# Patient Record
Sex: Male | Born: 1995 | Race: White | Hispanic: No | Smoking: Never smoker
Health system: Southern US, Community
[De-identification: ages and names within clinical notes are randomized; demographics above are authoritative.]

## PROBLEM LIST (undated history)

## (undated) DIAGNOSIS — I10 Essential (primary) hypertension: Secondary | ICD-10-CM

## (undated) DIAGNOSIS — T7840XA Allergy, unspecified, initial encounter: Secondary | ICD-10-CM

## (undated) DIAGNOSIS — F909 Attention-deficit hyperactivity disorder, unspecified type: Secondary | ICD-10-CM

## (undated) HISTORY — DX: Attention-deficit hyperactivity disorder, unspecified type: F90.9

## (undated) HISTORY — DX: Allergy, unspecified, initial encounter: T78.40XA

---
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
## 2018-04-07 ENCOUNTER — Ambulatory Visit: Payer: Self-pay

## 2018-04-07 ENCOUNTER — Other Ambulatory Visit: Payer: Self-pay | Admitting: Occupational Medicine

## 2018-04-07 DIAGNOSIS — Z0000 Encounter for general adult medical examination without abnormal findings: Secondary | ICD-10-CM

## 2019-05-09 ENCOUNTER — Emergency Department
Admission: EM | Admit: 2019-05-09 | Discharge: 2019-05-09 | Disposition: A | Discharge status: Home / Self Care | Payer: BC Managed Care – PPO | Attending: Emergency Medicine | Admitting: Emergency Medicine

## 2019-05-09 ENCOUNTER — Emergency Department: Payer: BC Managed Care – PPO

## 2019-05-09 ENCOUNTER — Other Ambulatory Visit: Payer: Self-pay

## 2019-05-09 DIAGNOSIS — S9032XA Contusion of left foot, initial encounter: Secondary | ICD-10-CM

## 2019-05-09 DIAGNOSIS — Y29XXXA Contact with blunt object, undetermined intent, initial encounter: Secondary | ICD-10-CM | POA: Diagnosis not present

## 2019-05-09 DIAGNOSIS — Y92019 Unspecified place in single-family (private) house as the place of occurrence of the external cause: Secondary | ICD-10-CM | POA: Insufficient documentation

## 2019-05-09 DIAGNOSIS — S99922A Unspecified injury of left foot, initial encounter: Secondary | ICD-10-CM | POA: Diagnosis present

## 2019-05-09 DIAGNOSIS — Y999 Unspecified external cause status: Secondary | ICD-10-CM | POA: Diagnosis not present

## 2019-05-09 DIAGNOSIS — Y9301 Activity, walking, marching and hiking: Secondary | ICD-10-CM | POA: Insufficient documentation

## 2019-05-09 MED ORDER — IBUPROFEN 600 MG PO TABS: 600.0000 mg | ORAL_TABLET | Freq: Four times a day (QID) | ORAL | 0 refills | Status: DC | PRN

## 2019-05-09 MED ORDER — IBUPROFEN 600 MG PO TABS
600.0000 mg | ORAL_TABLET | Freq: Once | ORAL | Status: AC
  Administered 2019-05-09: 600 mg via ORAL
  Filled 2019-05-09: qty 1

## 2019-05-09 NOTE — ED Provider Notes (Signed)
[facility] Emergency Department Provider Note  ____________________________________________  Time seen: Approximately 3:34 AM  I have reviewed the triage vital signs and the nursing notes.   HISTORY  Chief Complaint Foot Injury   HPI Billy Hamilton is a 23 y.o. male no significant past medical history presents for evaluation of left foot pain.  Patient reports that he stubbed his fourth and fifth left toes onto a corner of a wall.  That happened earlier in the evening.  Patient was unable to sleep due to the pain.  Has not taken any medications at home.  The pain is worse with weightbearing.  No fall or any other trauma.   History reviewed. No pertinent past medical history.  There are no active problems to display for this patient.   History reviewed. No pertinent surgical history.  Prior to Admission medications   Medication Sig Start Date End Date Taking? Authorizing Provider  ibuprofen (ADVIL) 600 MG tablet Take 1 tablet (600 mg total) by mouth every 6 (six) hours as needed. 05/09/19   Nita SickleVeronese, [place], MD    Allergies Patient has no known allergies.  No family history on file.  Social History Social History   Tobacco Use  . Smoking status: Never Smoker  . Smokeless tobacco: Current User  Substance Use Topics  . Alcohol use: Yes  . Drug use: Not on file    Review of Systems  Constitutional: Negative for fever. Eyes: Negative for visual changes. ENT: Negative for sore throat. Neck: No neck pain  Cardiovascular: Negative for chest pain. Respiratory: Negative for shortness of breath. Gastrointestinal: Negative for abdominal pain, vomiting or diarrhea. Genitourinary: Negative for dysuria. Musculoskeletal: Negative for back pain. + L foot pain Skin: Negative for rash. Neurological: Negative for headaches, weakness or numbness. Psych: No SI or HI  ____________________________________________   PHYSICAL EXAM:  VITAL SIGNS: ED  Triage Vitals  Enc Vitals Group     BP 05/09/19 0254 137/79     Pulse Rate 05/09/19 0254 83     Resp --      Temp 05/09/19 0254 98.3 F (36.8 C)     Temp Source 05/09/19 0254 Oral     SpO2 05/09/19 0254 98 %     Weight 05/09/19 0249 165 lb (74.8 kg)     Height 05/09/19 0249 5\' 11"  (1.803 m)     Head Circumference --      Peak Flow --      Pain Score 05/09/19 0250 8     Pain Loc --      Pain Edu? --      Excl. in GC? --     Constitutional: Alert and oriented. Well appearing and in no apparent distress. HEENT:      Head: Normocephalic and atraumatic.         Eyes: Conjunctivae are normal. Sclera is non-icteric.       Mouth/Throat: Mucous membranes are moist.       Neck: Supple with no signs of meningismus. Cardiovascular: Regular rate and rhythm. Respiratory: Normal respiratory effort.  Musculoskeletal: No swelling, bruising or erythema, patient is tender to palpation over the 4th and 5th metatarsal, no ankle tenderness, full painless ROM of the ankle Neurologic: Normal speech and language. Face is symmetric. Moving all extremities. No gross focal neurologic deficits are appreciated. Skin: Skin is warm, dry and intact. No rash noted. Psychiatric: Mood and affect are normal. Speech and behavior are normal.  ____________________________________________   LABS (all labs ordered  are listed, but only abnormal results are displayed)  Labs Reviewed - No data to display ____________________________________________  EKG  none  ____________________________________________  RADIOLOGY  I have personally reviewed the images performed during this visit and I agree with the Radiologist's read.   Interpretation by Radiologist:  Dg Foot Complete Left  Result Date: 05/09/2019 CLINICAL DATA:  Injury to fourth and fifth digits. EXAM: LEFT FOOT - COMPLETE 3+ VIEW COMPARISON:  None. FINDINGS: There is no evidence of fracture or dislocation. There is no evidence of arthropathy or other  focal bone abnormality. Soft tissues are unremarkable. IMPRESSION: Negative. Electronically Signed   By: Constance Holster M.D.   On: 05/09/2019 03:26     ____________________________________________   PROCEDURES  Procedure(s) performed: None Procedures Critical Care performed:  None ____________________________________________   INITIAL IMPRESSION / ASSESSMENT AND PLAN / ED COURSE  23 y.o. male no significant past medical history presents for evaluation of left foot pain after stubbing his fourth and fifth toes onto a wall earlier today.  Exam is reassuring with no bony deformities, bruising, or lacerations.  X-ray showing no evidence of fracture.  Patient was given Tylenol and crutches.  Sent home on supportive care, rest, ice, elevation, and ibuprofen.  Recommended follow-up with PCP.       As part of my medical decision making, I reviewed the following data within the [place] notes reviewed and incorporated, Radiograph reviewed , Notes from prior ED visits and  Controlled Substance Database   Patient was evaluated in Emergency Department today for the symptoms described in the history of present illness. Patient was evaluated in the context of the global COVID-19 pandemic, which necessitated consideration that the patient might be at risk for infection with the SARS-CoV-2 virus that causes COVID-19. Institutional protocols and algorithms that pertain to the evaluation of patients at risk for COVID-19 are in a state of rapid change based on information released by regulatory bodies including the CDC and federal and state organizations. These policies and algorithms were followed during the patient's care in the ED.   ____________________________________________   FINAL CLINICAL IMPRESSION(S) / ED DIAGNOSES   Final diagnoses:  Contusion of left foot, initial encounter      NEW MEDICATIONS STARTED DURING THIS VISIT:  ED Discharge Orders          Ordered    ibuprofen (ADVIL) 600 MG tablet  Every 6 hours PRN     05/09/19 0333           Note:  This document was prepared using Dragon voice recognition software and may include unintentional dictation errors.    Rudene Re, MD 05/09/19 [phone]

## 2019-05-09 NOTE — ED Triage Notes (Signed)
Patient was walking and hit left foot against corner of wall. Patient c/o pain and swelling to area.

## 2021-07-01 ENCOUNTER — Other Ambulatory Visit: Payer: Self-pay

## 2021-07-01 ENCOUNTER — Emergency Department (HOSPITAL_BASED_OUTPATIENT_CLINIC_OR_DEPARTMENT_OTHER): Payer: BC Managed Care – PPO

## 2021-07-01 ENCOUNTER — Emergency Department (HOSPITAL_BASED_OUTPATIENT_CLINIC_OR_DEPARTMENT_OTHER)
Admission: EM | Admit: 2021-07-01 | Discharge: 2021-07-01 | Disposition: A | Discharge status: Home / Self Care | Payer: BC Managed Care – PPO | Attending: Emergency Medicine | Admitting: Emergency Medicine

## 2021-07-01 ENCOUNTER — Encounter (HOSPITAL_BASED_OUTPATIENT_CLINIC_OR_DEPARTMENT_OTHER): Payer: Self-pay | Admitting: Emergency Medicine

## 2021-07-01 DIAGNOSIS — Z20822 Contact with and (suspected) exposure to covid-19: Secondary | ICD-10-CM | POA: Diagnosis not present

## 2021-07-01 DIAGNOSIS — J101 Influenza due to other identified influenza virus with other respiratory manifestations: Secondary | ICD-10-CM | POA: Diagnosis not present

## 2021-07-01 DIAGNOSIS — Z28310 Unvaccinated for covid-19: Secondary | ICD-10-CM | POA: Diagnosis not present

## 2021-07-01 DIAGNOSIS — J3489 Other specified disorders of nose and nasal sinuses: Secondary | ICD-10-CM | POA: Diagnosis not present

## 2021-07-01 DIAGNOSIS — F17200 Nicotine dependence, unspecified, uncomplicated: Secondary | ICD-10-CM | POA: Diagnosis not present

## 2021-07-01 DIAGNOSIS — J029 Acute pharyngitis, unspecified: Secondary | ICD-10-CM | POA: Diagnosis present

## 2021-07-01 LAB — RESP PANEL BY RT-PCR (FLU A&B, COVID) ARPGX2
Influenza A by PCR: POSITIVE — AB
Influenza B by PCR: NEGATIVE
SARS Coronavirus 2 by RT PCR: NEGATIVE

## 2021-07-01 MED ORDER — OSELTAMIVIR PHOSPHATE 75 MG PO CAPS: 75.0000 mg | ORAL_CAPSULE | Freq: Two times a day (BID) | ORAL | 0 refills | Status: DC

## 2021-07-01 NOTE — Discharge Instructions (Signed)
Please take Tamiflu as prescribed Continue acetaminophen 650 mg every 4 hours and/or ibuprofen 400 mg every 6 hours.  These are different medications work in different ways and you can take them simultaneously. Drink plenty of fluids Return if you are having worsening shortness of breath or inability to tolerate fluids

## 2021-07-01 NOTE — ED Provider Notes (Signed)
[facility] EMERGENCY DEPT Provider Note   CSN: [id number] Arrival date & time: 07/01/21  6301     History Chief Complaint  Patient presents with   flu sx    Billy Hamilton is a 25 y.o. male.  HPI 25 year old previously healthy male presents today complaining of flulike symptoms.  He states he became ill yesterday with nasal congestion followed by sore throat, fever, cough with some dyspnea.  Cough is nonproductive.  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  He denies having had COVID and has not had his COVID-vaccine or his flu vaccine this year.  He denies severe headache, neck pain, abdominal pain, vomiting, or diarrhea.  He is not a smoker but has some dyspnea associated with this.    Past Medical History:  Diagnosis Date   ADHD (attention deficit hyperactivity disorder)    Allergy     Patient Active Problem List   Diagnosis Date Noted   Influenza A 07/01/2021   ADD (attention deficit disorder) 07/09/2012    History reviewed. No pertinent surgical history.     Family History  Problem Relation Age of Onset   Hyperlipidemia Father    Diabetes Father    Hypertension Father    Gout Father     Social History   Tobacco Use   Smoking status: Never   Smokeless tobacco: Current  Substance Use Topics   Alcohol use: Yes    Home Medications Prior to Admission medications   Medication Sig Start Date End Date Taking? Authorizing Provider  oseltamivir (TAMIFLU) 75 MG capsule Take 1 capsule (75 mg total) by mouth every 12 (twelve) hours. 07/01/21  Yes Margarita Grizzle, MD  ibuprofen (ADVIL) 600 MG tablet Take 1 tablet (600 mg total) by mouth every 6 (six) hours as needed. 05/09/19   Nita Sickle, MD    Allergies    Patient has no known allergies.  Review of Systems   Review of Systems  All other systems reviewed and are negative.  Physical Exam Updated Vital Signs BP 118/86 (BP  Location: Right Arm)   Pulse 69   Temp 98.2 F (36.8 C) (Oral)   Resp 16   SpO2 100%   Physical Exam Vitals and nursing note reviewed.  Constitutional:      Appearance: He is well-developed.  HENT:     Head: Normocephalic and atraumatic.     Right Ear: External ear normal.     Left Ear: External ear normal.     Nose: Congestion and rhinorrhea present.     Mouth/Throat:     Pharynx: Oropharynx is clear.  Eyes:     Extraocular Movements: Extraocular movements intact.  Neck:     Trachea: No tracheal deviation.  Cardiovascular:     Rate and Rhythm: Normal rate and regular rhythm.  Pulmonary:     Effort: Pulmonary effort is normal.  Abdominal:     Palpations: Abdomen is soft.  Musculoskeletal:        General: Normal range of motion.     Cervical back: Normal range of motion.  Skin:    General: Skin is warm and dry.  Neurological:     Mental Status: He is alert and oriented to person, place, and time.  Psychiatric:        Mood and Affect: Mood normal.        Behavior: Behavior normal.    ED Results / Procedures / Treatments   Labs (  all labs ordered are listed, but only abnormal results are displayed) Labs Reviewed  RESP PANEL BY RT-PCR (FLU A&B, COVID) ARPGX2 - Abnormal; Notable for the following components:      Result Value   Influenza A by PCR POSITIVE (*)    All other components within normal limits    EKG None  Radiology DG Chest Port 1 View  Result Date: 07/01/2021 CLINICAL DATA:  Cough and fever. EXAM: PORTABLE CHEST 1 VIEW COMPARISON:  April 07, 2018 FINDINGS: The heart size and mediastinal contours are within normal limits. Both lungs are clear. The visualized skeletal structures are unremarkable. IMPRESSION: No active disease. Electronically Signed   By: Sherian Rein M.D.   On: 07/01/2021 10:23    Procedures Procedures   Medications Ordered in ED Medications - No data to display  ED Course  I have reviewed the triage vital signs and the nursing  notes.  Pertinent labs & imaging results that were available during my care of the patient were reviewed by me and considered in my medical decision making (see chart for details).  Clinical Course as of 07/01/21 1547  Sun Jul 01, 2021  1030 Checks x-Dameon Soltis personally reviewed no evidence of acute abnormality Radiology reading reviewed [DR]    Clinical Course User Index [DR] Margarita Grizzle, MD   MDM Rules/Calculators/A&P                            Final Clinical Impression(s) / ED Diagnoses Final diagnoses:  Influenza A    Rx / DC Orders ED Discharge Orders          Ordered    oseltamivir (TAMIFLU) 75 MG capsule  Every 12 hours        07/01/21 1017             Margarita Grizzle, MD 07/01/21 1547

## 2021-07-01 NOTE — ED Triage Notes (Signed)
Reports cough, fever, body aches and congestion since yesterday. He is in no distress.

## 2022-03-09 ENCOUNTER — Emergency Department (HOSPITAL_COMMUNITY): Payer: Self-pay

## 2022-03-09 ENCOUNTER — Other Ambulatory Visit: Payer: Self-pay

## 2022-03-09 ENCOUNTER — Emergency Department (HOSPITAL_COMMUNITY)
Admission: EM | Admit: 2022-03-09 | Discharge: 2022-03-10 | Disposition: A | Discharge status: Home / Self Care | Payer: Self-pay | Attending: Emergency Medicine | Admitting: Emergency Medicine

## 2022-03-09 ENCOUNTER — Encounter (HOSPITAL_COMMUNITY): Payer: Self-pay | Admitting: Emergency Medicine

## 2022-03-09 DIAGNOSIS — R519 Headache, unspecified: Secondary | ICD-10-CM | POA: Insufficient documentation

## 2022-03-09 DIAGNOSIS — I10 Essential (primary) hypertension: Secondary | ICD-10-CM | POA: Insufficient documentation

## 2022-03-09 DIAGNOSIS — R079 Chest pain, unspecified: Secondary | ICD-10-CM | POA: Insufficient documentation

## 2022-03-09 DIAGNOSIS — D72829 Elevated white blood cell count, unspecified: Secondary | ICD-10-CM | POA: Insufficient documentation

## 2022-03-09 DIAGNOSIS — R112 Nausea with vomiting, unspecified: Secondary | ICD-10-CM | POA: Insufficient documentation

## 2022-03-09 DIAGNOSIS — Z79899 Other long term (current) drug therapy: Secondary | ICD-10-CM | POA: Insufficient documentation

## 2022-03-09 LAB — CBC WITH DIFFERENTIAL/PLATELET
Abs Immature Granulocytes: 0.05 10*3/uL (ref 0.00–0.07)
Basophils Absolute: 0 10*3/uL (ref 0.0–0.1)
Basophils Relative: 0 %
Eosinophils Absolute: 0 10*3/uL (ref 0.0–0.5)
Eosinophils Relative: 0 %
HCT: 45.8 % (ref 39.0–52.0)
Hemoglobin: 16.4 g/dL (ref 13.0–17.0)
Immature Granulocytes: 0 %
Lymphocytes Relative: 11 %
Lymphs Abs: 1.5 10*3/uL (ref 0.7–4.0)
MCH: 32.9 pg (ref 26.0–34.0)
MCHC: 35.8 g/dL (ref 30.0–36.0)
MCV: 92 fL (ref 80.0–100.0)
Monocytes Absolute: 0.5 10*3/uL (ref 0.1–1.0)
Monocytes Relative: 4 %
Neutro Abs: 12 10*3/uL — ABNORMAL HIGH (ref 1.7–7.7)
Neutrophils Relative %: 85 %
Platelets: 211 10*3/uL (ref 150–400)
RBC: 4.98 MIL/uL (ref 4.22–5.81)
RDW: 11.8 % (ref 11.5–15.5)
WBC: 14.1 10*3/uL — ABNORMAL HIGH (ref 4.0–10.5)
nRBC: 0 % (ref 0.0–0.2)

## 2022-03-09 MED ORDER — DIPHENHYDRAMINE HCL 50 MG/ML IJ SOLN
25.0000 mg | Freq: Once | INTRAMUSCULAR | Status: AC
  Administered 2022-03-09: 25 mg via INTRAVENOUS
  Filled 2022-03-09: qty 1

## 2022-03-09 MED ORDER — SODIUM CHLORIDE 0.9 % IV BOLUS
1000.0000 mL | Freq: Once | INTRAVENOUS | Status: AC
  Administered 2022-03-09: 1000 mL via INTRAVENOUS

## 2022-03-09 MED ORDER — KETOROLAC TROMETHAMINE 30 MG/ML IJ SOLN
30.0000 mg | Freq: Once | INTRAMUSCULAR | Status: AC
Start: 2022-03-09 — End: 2022-03-09
  Administered 2022-03-09: 30 mg via INTRAVENOUS
  Filled 2022-03-09: qty 1

## 2022-03-09 MED ORDER — METOCLOPRAMIDE HCL 5 MG/ML IJ SOLN
10.0000 mg | Freq: Once | INTRAMUSCULAR | Status: AC
  Administered 2022-03-09: 10 mg via INTRAVENOUS
  Filled 2022-03-09: qty 2

## 2022-03-09 NOTE — ED Triage Notes (Addendum)
Pt reports high bp, headache, abd pain, and vomiting today. Starting at 8p. Highest BP at home was 200/100. No meds prior to arrival

## 2022-03-09 NOTE — ED Provider Notes (Signed)
[facility]EMERG[facility] DE[facility] Emergency Department Provider Note MRN:  [id number]  Arrival date & time: 03/10/22     Chief Complaint   Headache   History of Present Illness   Billy Hamilton is a 26 y.o. year-old male presents to the ED with chief complaint of headache with associated nausea, vomiting, chest pain in the setting of very high blood pressure.  Patient states that the symptoms started earlier tonight.  He has history of undiagnosed HTN and doesn't take anything.  He states he was in his normal state of health today and then this evening began vomiting and his friend who is a paramedic took his BP and it was 190s/100s.  History provided by patient.   Review of Systems  Pertinent review of systems noted in HPI.    Physical Exam   Vitals:   03/10/22 0100 03/10/22 0145  BP: (!) 113/54 117/76  Pulse: 68 66  Resp: 15 13  Temp:    SpO2: 94% 96%    CONSTITUTIONAL:  nontoxic-appearing, NAD NEURO:  Alert and oriented x 3, CN 3-12 grossly intact, normal finger to nose, no pronator drift, normal gait EYES:  eyes equal and reactive ENT/NECK:  Supple, no stridor  CARDIO:  normal rate, regular rhythm, appears well-perfused  PULM:  No respiratory distress, CTAB GI/GU:  non-distended, no focal tenderness MSK/SPINE:  No gross deformities, no edema, moves all extremities  SKIN:  no rash, atraumatic   *Additional and/or pertinent findings included in MDM below  Diagnostic and Interventional Summary    EKG Interpretation  Date/Time:  Saturday March 09 2022 23:07:39 EDT Ventricular Rate:  85 PR Interval:  155 QRS Duration: 100 QT Interval:  348 QTC Calculation: 414 R Axis:   96 Text Interpretation: Sinus rhythm Borderline right axis deviation ST elev, probable normal early repol pattern No significant change was found Confirmed by Glynn Octave [phone]) on 03/09/2022 11:10:22 PM       Labs Reviewed  CBC WITH DIFFERENTIAL/PLATELET - Abnormal; Notable for  the following components:      Result Value   WBC 14.1 (*)    Neutro Abs 12.0 (*)    All other components within normal limits  BASIC METABOLIC PANEL - Abnormal; Notable for the following components:   Glucose, Bld 108 (*)    All other components within normal limits  TROPONIN I (HIGH SENSITIVITY)  TROPONIN I (HIGH SENSITIVITY)    DG Chest 2 View  Final Result      Medications  ketorolac (TORADOL) 30 MG/ML injection 30 mg (30 mg Intravenous Given 03/09/22 2318)  metoCLOPramide (REGLAN) injection 10 mg (10 mg Intravenous Given 03/09/22 2318)  diphenhydrAMINE (BENADRYL) injection 25 mg (25 mg Intravenous Given 03/09/22 2318)  sodium chloride 0.9 % bolus 1,000 mL (0 mLs Intravenous Stopped 03/09/22 2352)     Procedures  /  Critical Care Procedures  ED Course and Medical Decision Making  I have reviewed the triage vital signs, the nursing notes, and pertinent available records from the EMR.  Social Determinants Affecting Complexity of Care: Patient has no clinically significant social determinants affecting this chief complaint..   ED Course:   Patient here with headache, vomiting, chest pain.  Top differential diagnoses include hypertensive emergency vs urgency, ACS, PE, covid, pneumonia. Medical Decision Making Patient here with headache and vomiting and elevated blood pressure.  Labs as discussed below.  Doubt ACS, troponins are negative.  Doubt PE, patient is not tachycardic nor hypoxic.  Patient feeling improved after headache cocktail.  I doubt meningitis, he is afebrile and nontoxic in appearance.  Problems Addressed: Chest pain, unspecified type: acute illness or injury Nonintractable headache, unspecified chronicity pattern, unspecified headache type: acute illness or injury  Amount and/or Complexity of Data Reviewed Labs: ordered.    Details: Labs notable for nonspecific leukocytosis to 14.1, afebrile, no meningismus, doubt meningitis.  Troponins are 2x2, doubt ACS.   No significant electrolyte derangement.  Patient's most recent visit Radiology: ordered. ECG/medicine tests: ordered and independent interpretation performed.    Details: Probable early repole  Risk Prescription drug management.     Consultants: No consultations were needed in caring for this patient.   Treatment and Plan: Emergency department workup does not suggest an emergent condition requiring admission or immediate intervention beyond  what has been performed at this time. The patient is safe for discharge and has  been instructed to return immediately for worsening symptoms, change in  symptoms or any other concerns    Final Clinical Impressions(s) / ED Diagnoses     ICD-10-CM   1. Nonintractable headache, unspecified chronicity pattern, unspecified headache type  R51.9     2. Chest pain, unspecified type  R07.9       ED Discharge Orders     None         Discharge Instructions Discussed with and Provided to Patient:     Discharge Instructions      I recommend that you discuss your blood pressure with your primary care doctor.  If your symptoms change or worsen make an appointment with your doctor or return to the emergency department.  Monitor for fever over the next few days.       Roxy Horseman, PA-C 03/10/22 0305    Molpus, Jonny Ruiz, MD 03/10/22 [phone]

## 2022-03-10 LAB — BASIC METABOLIC PANEL
Anion gap: 10 (ref 5–15)
BUN: 9 mg/dL (ref 6–20)
CO2: 22 mmol/L (ref 22–32)
Calcium: 9.3 mg/dL (ref 8.9–10.3)
Chloride: 106 mmol/L (ref 98–111)
Creatinine, Ser: 0.76 mg/dL (ref 0.61–1.24)
GFR, Estimated: >60 mL/min (ref >60)
Glucose, Bld: 108 mg/dL — ABNORMAL HIGH (ref 70–99)
Potassium: 3.8 mmol/L (ref 3.5–5.1)
Sodium: 138 mmol/L (ref 135–145)

## 2022-03-10 LAB — TROPONIN I (HIGH SENSITIVITY)
Troponin I (High Sensitivity): 2 ng/L (ref <18)
Troponin I (High Sensitivity): <2 ng/L (ref <18)

## 2022-03-10 NOTE — Discharge Instructions (Signed)
I recommend that you discuss your blood pressure with your primary care doctor.  If your symptoms change or worsen make an appointment with your doctor or return to the emergency department.  Monitor for fever over the next few days.

## 2024-05-01 ENCOUNTER — Encounter: Payer: Self-pay | Admitting: Emergency Medicine

## 2024-05-01 ENCOUNTER — Ambulatory Visit: Admission: EM | Admit: 2024-05-01 | Discharge: 2024-05-01 | Disposition: A | Discharge status: Home / Self Care

## 2024-05-01 DIAGNOSIS — H5789 Other specified disorders of eye and adnexa: Secondary | ICD-10-CM

## 2024-05-01 HISTORY — DX: Essential (primary) hypertension: I10

## 2024-05-01 MED ORDER — POLYMYXIN B-TRIMETHOPRIM 10000-0.1 UNIT/ML-% OP SOLN: 1.0000 [drp] | Freq: Four times a day (QID) | OPHTHALMIC | 0 refills | Status: AC

## 2024-05-01 NOTE — ED Triage Notes (Signed)
 Right eye red and irritated after removing contact yesterday.  Denies vision changes

## 2024-05-01 NOTE — ED Provider Notes (Signed)
 [facility]    CSN: [id number] Arrival date & time: 05/01/24  0906      History   Chief Complaint No chief complaint on file.   HPI Billy Hamilton is a 28 y.o. male.   The history is provided by the patient and the spouse.   Patient presents with his spouse for a 1 day history of right eye redness, irritation, and pain.  Patient states that he took his contact lens out and developed symptoms.  Patient's wife states that the patient has had his contacts in for the past 4 to 5 months when they are only wearable for 1 day.  Patient endorses light sensitivity.  He denies  purulent drainage, visual changes, headache, dizziness, or eye swelling.  So far, he has not used any medications for his symptoms.  States he has experienced a similar episode in the past.  Past Medical History:  Diagnosis Date   ADHD (attention deficit hyperactivity disorder)    Allergy    Hypertension     Patient Active Problem List   Diagnosis Date Noted   Influenza A 07/01/2021   ADD (attention deficit disorder) 07/09/2012    History reviewed. No pertinent surgical history.     Home Medications    Prior to Admission medications   Medication Sig Start Date End Date Taking? Authorizing Provider  amLODipine (NORVASC) 10 MG tablet Take 10 mg by mouth daily. Takes another 5mg  at end of day   Yes [provider]    Family History Family History  Problem Relation Age of Onset   Hyperlipidemia Father    Diabetes Father    Hypertension Father    Gout Father     Social History Social History   Tobacco Use   Smoking status: Never   Smokeless tobacco: Current  Substance Use Topics   Alcohol use: Yes   Drug use: Never     Allergies   Patient has no known allergies.   Review of Systems Review of Systems Per HPI  Physical Exam Triage Vital Signs ED Triage Vitals  Encounter Vitals Group     BP 05/01/24 0920 (!) 145/93     Girls Systolic BP Percentile --       Girls Diastolic BP Percentile --      Boys Systolic BP Percentile --      Boys Diastolic BP Percentile --      Pulse Rate 05/01/24 0920 79     Resp 05/01/24 0920 18     Temp 05/01/24 0920 98.3 F (36.8 C)     Temp Source 05/01/24 0920 Oral     SpO2 05/01/24 0920 97 %     Weight --      Height --      Head Circumference --      Peak Flow --      Pain Score 05/01/24 0921 4     Pain Loc --      Pain Education --      Exclude from Growth Chart --    No data found.  Updated Vital Signs BP (!) 145/93 (BP Location: Right Arm)   Pulse 79   Temp 98.3 F (36.8 C) (Oral)   Resp 18   SpO2 97%   Visual Acuity Right Eye Distance: 20/30 Left Eye Distance: 20/25 Bilateral Distance: 20/25  Right Eye Near:   Left Eye Near:    Bilateral Near:     Physical Exam Vitals and nursing note reviewed.  Constitutional:  General: He is not in acute distress.    Appearance: Normal appearance.  HENT:     Head: Normocephalic.  Eyes:     General: Lids are normal. Vision grossly intact. No visual field deficit.       Right eye: No foreign body, discharge or hordeolum.     Extraocular Movements: Extraocular movements intact.     Right eye: Normal extraocular motion and no nystagmus.     Conjunctiva/sclera:     Right eye: Right conjunctiva is injected.     Pupils: Pupils are equal, round, and reactive to light.     Comments: Mild injection noted of the right conjunctiva/sclera.  There is no purulent drainage present.  Cardiovascular:     Rate and Rhythm: Normal rate and regular rhythm.     Pulses: Normal pulses.     Heart sounds: Normal heart sounds.  Pulmonary:     Effort: Pulmonary effort is normal. No respiratory distress.     Breath sounds: Normal breath sounds. No stridor. No wheezing, rhonchi or rales.  Musculoskeletal:     Cervical back: Normal range of motion.  Skin:    General: Skin is warm and dry.  Neurological:     General: No focal deficit present.     Mental Status:  He is alert and oriented to person, place, and time.  Psychiatric:        Mood and Affect: Mood normal.        Behavior: Behavior normal.      UC Treatments / Results  Labs (all labs ordered are listed, but only abnormal results are displayed) Labs Reviewed - No data to display  EKG   Radiology No results found.  Procedures Procedures (including critical care time)  Medications Ordered in UC Medications - No data to display  Initial Impression / Assessment and Plan / UC Course  I have reviewed the triage vital signs and the nursing notes.  Pertinent labs & imaging results that were available during my care of the patient were reviewed by me and considered in my medical decision making (see chart for details).  Will treat patient for eye irritation with Polytrim  eyedrops.  Visual acuity is intact.  Symptoms consistent with mechanism of injury.  Supportive care recommendations were provided and discussed with the patient to include over-the-counter analgesics, warm compresses to the eye, and to use over-the-counter eyedrops to help keep the eye moist and lubricated.  Discussed indications with patient regarding follow-up.  Patient was in agreement with this plan of care and verbalizes understanding.  All questions were answered.  Patient stable for discharge.   Final Clinical Impressions(s) / UC Diagnoses   Final diagnoses:  None   Discharge Instructions   None    ED Prescriptions   None    PDMP not reviewed this encounter.   Gilmer Etta PARAS, NP 05/01/24 [phone]

## 2024-05-01 NOTE — Discharge Instructions (Signed)
 Use eyedrops as prescribed.   Apply warm compresses to the right eye to help with pain or discomfort.  Cool compresses to the eyes to help with pain or swelling. Recommend use of over-the-counter eyedrops such as Clear Eyes or Visine to keep the eye moist and lubricated.   Strict handwashing when applying medication.  Avoid rubbing or manipulating the eyes while symptoms persist. Wear eyeglasses until symptoms improved. If symptoms fail to improve with this treatment, follow-up with your eye doctor for further evaluation. Follow-up as needed.
